# Patient Record
Sex: Male | Born: 1990 | Race: White | Hispanic: No | Marital: Single | Smoking: Current some day smoker
Health system: Southern US, Community
[De-identification: ages and names within clinical notes are randomized; demographics above are authoritative.]

## PROBLEM LIST (undated history)

## (undated) DIAGNOSIS — N390 Urinary tract infection, site not specified: Secondary | ICD-10-CM

## (undated) HISTORY — PX: CIRCUMCISION: SUR203

---

## 2012-03-03 ENCOUNTER — Encounter (HOSPITAL_COMMUNITY): Payer: Self-pay | Admitting: *Deleted

## 2012-03-03 ENCOUNTER — Emergency Department (HOSPITAL_COMMUNITY)
Admission: EM | Admit: 2012-03-03 | Discharge: 2012-03-04 | Disposition: A | Discharge status: Home / Self Care | Payer: Medicaid Other | Source: Home / Self Care | Attending: Emergency Medicine | Admitting: Emergency Medicine

## 2012-03-03 DIAGNOSIS — N390 Urinary tract infection, site not specified: Secondary | ICD-10-CM | POA: Insufficient documentation

## 2012-03-03 DIAGNOSIS — Z9889 Other specified postprocedural states: Secondary | ICD-10-CM | POA: Insufficient documentation

## 2012-03-03 DIAGNOSIS — R339 Retention of urine, unspecified: Secondary | ICD-10-CM | POA: Insufficient documentation

## 2012-03-03 DIAGNOSIS — N5089 Other specified disorders of the male genital organs: Secondary | ICD-10-CM | POA: Insufficient documentation

## 2012-03-03 NOTE — ED Notes (Signed)
Pt presents to ED accompanied by father c/o urinary retention. He stated that he was seen at [facility] 4 wks. ago and a foley catheter was placed. The catheter has been occluded x 4 hours. He denies bladder pressure currently. However had an episode 3 hours ago where the catheter was leaking and he felt pressure. Per pt, [facility] ED was unable to flush catheter.    F/u appt. With [facility] on Wed, and [facility].

## 2012-03-04 ENCOUNTER — Emergency Department (HOSPITAL_COMMUNITY)
Admission: EM | Admit: 2012-03-04 | Discharge: 2012-03-04 | Disposition: A | Discharge status: Home / Self Care | Payer: Medicaid Other | Attending: Emergency Medicine | Admitting: Emergency Medicine

## 2012-03-04 ENCOUNTER — Emergency Department (HOSPITAL_COMMUNITY): Payer: Medicaid Other

## 2012-03-04 DIAGNOSIS — N451 Epididymitis: Secondary | ICD-10-CM

## 2012-03-04 DIAGNOSIS — N390 Urinary tract infection, site not specified: Secondary | ICD-10-CM

## 2012-03-04 LAB — URINALYSIS, ROUTINE W REFLEX MICROSCOPIC
Bilirubin Urine: NEGATIVE
Ketones, ur: NEGATIVE mg/dL
Nitrite: POSITIVE — AB
Specific Gravity, Urine: 1.024 (ref 1.005–1.030)
Urobilinogen, UA: 0.2 mg/dL (ref 0.0–1.0)

## 2012-03-04 LAB — URINE MICROSCOPIC-ADD ON

## 2012-03-04 MED ORDER — SULFAMETHOXAZOLE-TRIMETHOPRIM 800-160 MG PO TABS: 1.0000 | ORAL_TABLET | Freq: Two times a day (BID) | ORAL | Status: AC

## 2012-03-04 MED ORDER — SULFAMETHOXAZOLE-TMP DS 800-160 MG PO TABS
1.0000 | ORAL_TABLET | Freq: Once | ORAL | Status: AC
  Administered 2012-03-04: 1 via ORAL
  Filled 2012-03-04: qty 1

## 2012-03-04 MED ORDER — DOXYCYCLINE HYCLATE 100 MG PO CAPS: 100.0000 mg | ORAL_CAPSULE | Freq: Two times a day (BID) | ORAL | Status: AC

## 2012-03-04 NOTE — Discharge Instructions (Signed)
You were seen and evaluated again for your difficulty urinating to your catheter. This was flushed at this time your providers feel your catheter is working properly. You have been given a new prescription for an antibiotic to take for the next 6 weeks. Please take this as prescribed for the folate that time. It is very important that you keep your appointment at [facility] for further followup and treatment of your conditions. If you develop any worsening symptoms you may return to the emergency room at any time.    Urinary Tract Infection Infections of the urinary tract can start in several places. A bladder infection (cystitis), a kidney infection (pyelonephritis), and a prostate infection (prostatitis) are different types of urinary tract infections (UTIs). They usually get better if treated with medicines (antibiotics) that kill germs. Take all the medicine until it is gone. You or your child may feel better in a few days, but TAKE ALL MEDICINE or the infection may not respond and may become more difficult to treat. HOME CARE INSTRUCTIONS   Drink enough water and fluids to keep the urine clear or pale yellow. Cranberry juice is especially recommended, in addition to large amounts of water.   Avoid caffeine, tea, and carbonated beverages. They tend to irritate the bladder.   Alcohol may irritate the prostate.   Only take over-the-counter or prescription medicines for pain, discomfort, or fever as directed by your caregiver.  To prevent further infections:  Empty the bladder often. Avoid holding urine for long periods of time.   After a bowel movement, women should cleanse from front to back. Use each tissue only once.   Empty the bladder before and after sexual intercourse.  FINDING OUT THE RESULTS OF YOUR TEST Not all test results are available during your visit. If your or your child's test results are not back during the visit, make an appointment with your caregiver to find out the results.  Do not assume everything is normal if you have not heard from your caregiver or the medical facility. It is important for you to follow up on all test results. SEEK MEDICAL CARE IF:   There is back pain.   Your baby is older than 3 months with a rectal temperature of 100.5 F (38.1 C) or higher for more than 1 day.   Your or your child's problems (symptoms) are no better in 3 days. Return sooner if you or your child is getting worse.  SEEK IMMEDIATE MEDICAL CARE IF:   There is severe back pain or lower abdominal pain.   You or your child develops chills.   You have a fever.   Your baby is older than 3 months with a rectal temperature of 102 F (38.9 C) or higher.   Your baby is 48 months old or younger with a rectal temperature of 100.4 F (38 C) or higher.   There is nausea or vomiting.   There is continued burning or discomfort with urination.  MAKE SURE YOU:   Understand these instructions.   Will watch your condition.   Will get help right away if you are not doing well or get worse.  Document Released: 06/07/2005 Document Revised: 08/17/2011 Document Reviewed: 01/10/2007 [facility] Patient Information 2012 [place], [place].

## 2012-03-04 NOTE — ED Notes (Signed)
Foley catheter replaced per MD request, pt tolerated well, + urine return, cloudy, foul smelling, pt states relief, MD made aware

## 2012-03-04 NOTE — ED Provider Notes (Signed)
History     CSN: [id number]  Arrival date & time 03/03/12  2304   First MD Initiated Contact with Patient 03/04/12 0006      Chief Complaint  Patient presents with  . Urinary Retention   HPI  History provided by the patient and father. Patient is a 21 year old male who presents with complaints of urinary retention and Foley catheter blockage. Patient and father are poor historians but report that patient has history of prior urological complication and possible infection in the past with a surgical procedure 4-5 years ago. Patient recently had problems with swelling of his scrotum area and urinary retention. He patient states he was seen at [facility] urology but is not recall which doctor he saw and had a Foley catheter placed. Patient states that this was in 1-2 months ago. She had been doing well since that time and was also scheduled for a specialty followup at [facility] this coming Wednesday. Earlier yesterday patient began to have some bladder pressure and urinary retention with leakage around his Foley catheter. Patient went to the [facility] needed and states it the nurses and staff tried to flush his catheter but were unable. Patient was encouraged to come here for additional evaluation and followup. Patient denies any other associated symptoms. He does report having some increased swelling of his scrotum area over the past one to 2 days. He denies any significant pains at this time. He denies any fever, chills, sweats, nausea vomiting.    No past medical history on file.  Past Surgical History  Procedure Date  . Circumcision     No family history on file.  History  Substance Use Topics  . Smoking status: Not on file  . Smokeless tobacco: Not on file  . Alcohol Use: Not on file      Review of Systems  Constitutional: Negative for fever and chills.  Gastrointestinal: Negative for nausea, vomiting and abdominal pain.  Genitourinary: Positive for scrotal swelling  and difficulty urinating. Negative for flank pain.    Allergies  Review of patient's allergies indicates no known allergies.  Home Medications  No current outpatient prescriptions on file.  BP 168/84  Pulse 101  Temp 98.4 F (36.9 C) (Oral)  Resp 20  SpO2 99%  Physical Exam  Nursing note and vitals reviewed. Constitutional: He is oriented to person, place, and time. He appears well-developed and well-nourished. No distress.  HENT:  Head: Normocephalic.  Cardiovascular: Normal rate and regular rhythm.   Pulmonary/Chest: Effort normal and breath sounds normal.  Abdominal: Soft. There is no tenderness. There is no rebound and no guarding.  Genitourinary:       Large amount of swelling to scrotum.  No erythema or induration.  Foley catheter in place.  Small amount of drainage around catheter.  Neurological: He is alert and oriented to person, place, and time.  Skin: Skin is warm.  Psychiatric: He has a normal mood and affect. His behavior is normal.    ED Course  Procedures   Results for orders placed during the hospital encounter of 03/03/12  URINALYSIS, ROUTINE W REFLEX MICROSCOPIC      Component Value Range   Color, Urine YELLOW  YELLOW   APPearance TURBID (*) CLEAR   Specific Gravity, Urine 1.024  1.005 - 1.030   pH 6.5  5.0 - 8.0   Glucose, UA NEGATIVE  NEGATIVE mg/dL   Hgb urine dipstick LARGE (*) NEGATIVE   Bilirubin Urine NEGATIVE  NEGATIVE   Ketones,  ur NEGATIVE  NEGATIVE mg/dL   Protein, ur >829 (*) NEGATIVE mg/dL   Urobilinogen, UA 0.2  0.0 - 1.0 mg/dL   Nitrite POSITIVE (*) NEGATIVE   Leukocytes, UA LARGE (*) NEGATIVE  URINE MICROSCOPIC-ADD ON      Component Value Range   Squamous Epithelial / LPF RARE  RARE   WBC, UA TOO NUMEROUS TO COUNT  <3 WBC/hpf   RBC / HPF TOO NUMEROUS TO COUNT  <3 RBC/hpf   Bacteria, UA MANY (*) RARE       US Scrotum  03/04/2012  *RADIOLOGY REPORT*  Clinical Data:  Urinary retention.  Status post correction of urethral  stricture at 4 weeks.  Foley catheter.  On antibiotics.  SCROTAL ULTRASOUND DOPPLER ULTRASOUND OF THE TESTICLES  Technique: Complete ultrasound examination of the testicles, epididymis, and other scrotal structures was performed.  Color and spectral Doppler ultrasound were also utilized to evaluate blood flow to the testicles.  Comparison:  None  Findings:  Right testis:  The right testis measures 3.6 x 2.3 x 2.2 cm.  No focal mass lesions.  Homogeneous testicular parenchymal echotexture.  Color flow Doppler images demonstrate normal flow. There is diffuse scrotal skin thickening and skin edema on the right.  Left testis:  The left testis measures 3.5 x 2.3 x 2.4 cm.  No focal mass lesions.  Homogeneous testicular parenchymal echotexture.  Color flow Doppler images demonstrate normal flow. There is diffuse scrotal skin thickening and stent edema on the right.  Right epididymis:  The epididymis demonstrates normal size, appearance, and color flow signal.  Left epididymis:  The mild edema in the left epididymis without abnormal increased flow.  Hydocele:  No significant hydroceles are demonstrated.  Varicocele:  No significant varicoceles are demonstrated.  Pulsed Doppler interrogation of both testes demonstrates low resistance flow bilaterally.  IMPRESSION: Diffuse scrotal skin thickening and edema.  Normal appearance of the testes.  No evidence of mass or torsion.  Suggestion of mild edema in the left epididymis. Normal epididymal flow.  Original Report Authenticated By: Marlon Pel, M.D.   [place] Art/ven Flow Abd Pelv Doppler  03/04/2012  *RADIOLOGY REPORT*  Clinical Data:  Urinary retention.  Status post correction of urethral stricture at 4 weeks.  Foley catheter.  On antibiotics.  SCROTAL ULTRASOUND DOPPLER ULTRASOUND OF THE TESTICLES  Technique: Complete ultrasound examination of the testicles, epididymis, and other scrotal structures was performed.  Color and spectral Doppler ultrasound were also  utilized to evaluate blood flow to the testicles.  Comparison:  None  Findings:  Right testis:  The right testis measures 3.6 x 2.3 x 2.2 cm.  No focal mass lesions.  Homogeneous testicular parenchymal echotexture.  Color flow Doppler images demonstrate normal flow. There is diffuse scrotal skin thickening and skin edema on the right.  Left testis:  The left testis measures 3.5 x 2.3 x 2.4 cm.  No focal mass lesions.  Homogeneous testicular parenchymal echotexture.  Color flow Doppler images demonstrate normal flow. There is diffuse scrotal skin thickening and stent edema on the right.  Right epididymis:  The epididymis demonstrates normal size, appearance, and color flow signal.  Left epididymis:  The mild edema in the left epididymis without abnormal increased flow.  Hydocele:  No significant hydroceles are demonstrated.  Varicocele:  No significant varicoceles are demonstrated.  Pulsed Doppler interrogation of both testes demonstrates low resistance flow bilaterally.  IMPRESSION: Diffuse scrotal skin thickening and edema.  Normal appearance of the testes.  No evidence of mass  or torsion.  Suggestion of mild edema in the left epididymis. Normal epididymal flow.  Original Report Authenticated By: Marlon Pel, M.D.     1. UTI (lower urinary tract infection)       MDM  12:30 AM patient seen and evaluated. Patient in no acute distress.   Patient has refused all blood testing. Urine does show strong signs for UTI. Patient is afebrile but has very slight tachycardia. He does not appear acutely ill or toxic. Blood pressure is stable. Patient has good followup with specialized urology appointment due to this Wednesday and additional followup at [facility] urology on Thursday. At this time we'll discharge home with antibiotics for UTI. Urine culture pending.   Patient was also seen and evaluated by attending physician. Patient has good followup and will be discharged at this time.  Angus Seller,  [place] 03/04/12 [phone]

## 2012-03-04 NOTE — ED Provider Notes (Signed)
Medical screening examination/treatment/procedure(s) were conducted as a shared visit with non-physician practitioner(s) and myself.  I personally evaluated the patient during the encounter This case is well described by PA Dammen.  On my exam this young male was in no distress, his Foley catheter inserted and replaced, and is not actively leaking around it.  The patient's evaluation was largely reassuring, though with continued evidence of an infection.  After the patient's symptoms improved, he was discharged with instructions to follow up with urology expeditiously.  Gerhard Munch, MD 03/04/12 [phone]

## 2012-03-04 NOTE — ED Provider Notes (Signed)
Bern Fare S  5:50AM  patient returns after being seen earlier this morning for similar complaints of Foley catheter complications blockage. See previous note for full details. Patient was found to have signs for ear check in infection with pyuria and large clumps of WBCs. Patient reports that upon leaving he began had slowed drainage from catheter with leakage around the sides of the catheter.   Spoke with Dr. Margarita Grizzle with urology.  He recommends using doxycycline to treat patient's UTI and possible epididymitis for an extended course of 6 weeks. He recommends 100 mg twice a day for first 2 weeks followed by 100 mg daily for the last 4 weeks. He was able to review some notes on the patient which shows that patient has some congenital changes with urological complications. Patient has had prior surgeries at [facility] as a child. He did express importance the patient and father followup with [facility] as planned this week as they will be the best source for the patient to get further advanced treatments for his conditions.   I spoke with patient and father about a change in antibiotics. I also expressed repeatedly the importance to keep appointment at [facility] this week. They expressed their understanding and plan to make the appointment. Patient was advised she may return if he develops any further palpitations with catheter.  Angus Seller, [place] 03/04/12 [phone]

## 2012-03-04 NOTE — ED Notes (Signed)
Per lab/phlebotomist, pt refused lab work.

## 2012-03-04 NOTE — ED Notes (Signed)
Pt alert, nad, returns to ER with cont problems with urinary catheter, no urine output until arrival, cont leakage around catheter

## 2012-03-04 NOTE — ED Notes (Signed)
Ultrasound in progress  

## 2012-03-04 NOTE — ED Notes (Signed)
Attempted to flush foley. Unable to push fluid in or draw from foley. Pt tolerated well, denies pain.

## 2012-03-04 NOTE — Discharge Instructions (Signed)
You were seen and evaluated for your difficulties with urination in your catheter problems. Your catheter was changed for a new catheter. Your urine today does show signs for a urinary tract infection. You have been given a prescription for antibiotics to help treat this infection. Please followup with your urology specialist appointments this week as planned.  Urinary Tract Infection Infections of the urinary tract can start in several places. A bladder infection (cystitis), a kidney infection (pyelonephritis), and a prostate infection (prostatitis) are different types of urinary tract infections (UTIs). They usually get better if treated with medicines (antibiotics) that kill germs. Take all the medicine until it is gone. You or your child may feel better in a few days, but TAKE ALL MEDICINE or the infection may not respond and may become more difficult to treat. HOME CARE INSTRUCTIONS   Drink enough water and fluids to keep the urine clear or pale yellow. Cranberry juice is especially recommended, in addition to large amounts of water.   Avoid caffeine, tea, and carbonated beverages. They tend to irritate the bladder.   Alcohol may irritate the prostate.   Only take over-the-counter or prescription medicines for pain, discomfort, or fever as directed by your caregiver.  To prevent further infections:  Empty the bladder often. Avoid holding urine for long periods of time.   After a bowel movement, women should cleanse from front to back. Use each tissue only once.   Empty the bladder before and after sexual intercourse.  FINDING OUT THE RESULTS OF YOUR TEST Not all test results are available during your visit. If your or your child's test results are not back during the visit, make an appointment with your caregiver to find out the results. Do not assume everything is normal if you have not heard from your caregiver or the medical facility. It is important for you to follow up on all test  results. SEEK MEDICAL CARE IF:   There is back pain.   Your baby is older than 3 months with a rectal temperature of 100.5 F (38.1 C) or higher for more than 1 day.   Your or your child's problems (symptoms) are no better in 3 days. Return sooner if you or your child is getting worse.  SEEK IMMEDIATE MEDICAL CARE IF:   There is severe back pain or lower abdominal pain.   You or your child develops chills.   You have a fever.   Your baby is older than 3 months with a rectal temperature of 102 F (38.9 C) or higher.   Your baby is 35 months old or younger with a rectal temperature of 100.4 F (38 C) or higher.   There is nausea or vomiting.   There is continued burning or discomfort with urination.  MAKE SURE YOU:   Understand these instructions.   Will watch your condition.   Will get help right away if you are not doing well or get worse.  Document Released: 06/07/2005 Document Revised: 08/17/2011 Document Reviewed: 01/10/2007 [facility] Patient Information 2012 [place], [place].   RESOURCE GUIDE  Chronic Pain Problems: Contact [facility]  [phone] Patients need to be referred by their primary care doctor.  Insufficient Money for Medicine: Contact United Way:  call "211" or [facility] ([phone].  No Primary Care Doctor: - Call [facility]  [phone] - can help you locate a primary care doctor that  accepts your insurance, provides certain services, etc. - Physician Referral Service- [phone]  Agencies  that provide inexpensive medical care: - [facility]  [phone] - [facility]  [phone] - [facility]  [phone] - Women's Clinic  [phone] - [facility] Parenthood  [phone] [facility]  [phone]  Medicaid-accepting [facility] Providers: - [facility]- [address]  [phone], Mon-Fri 9am-7pm, Sat 9am-1pm - [facility]- [address] [place], [address] [place]  [phone] - [facility]- [address] [place]  [phone] [facility]- [address]  [phone] - Renaye Rakers- [address] [place], [address], [phone]  Only accepts [place] patients after they have their name  applied to their card  Self Pay (no insurance) in [facility]: - Sickle Cell Patients: Dr Willey Blade, [facility]  [address] [place], [phone] - [facility]- [address] [place]  [phone]       [facility]- [address]       -     [facility]- see information above (Speak to [employer] if you do not have insurance)       -  [facility]- [address] [place], [phone]       -  [facility]- [address],  [phone]       -  [facility]- [address] [phone]       -  Dr Julio Sicks-  [address], [place], [phone]       -  [facility]- [address] [phone]       -  [facility]- [address] [phone], also [address] [phone]       -    [facility]- [address] [place], [phone], 1st & 3rd Saturday   every month, 10am-1pm  1) Find a Doctor and Pay Out of Pocket Although you won't have to find out who is covered by your insurance plan, it is a good idea to ask around and get recommendations. You will then need to call the office and see if the doctor you have chosen will accept you as a new patient and what types of options they offer for patients who are self-pay. Some doctors offer discounts or will set up payment plans for their patients who do not have insurance, but you will need to ask so you aren't surprised when you get to your appointment.  2) Contact Your Local Health Department Not all health departments have doctors that can see patients for sick visits, but many do, so  it is worth a call to see if yours does. If you don't know where your local health department is, you can check in your phone book. The CDC also has a tool to help you locate your state's health department, and many state websites also have listings of all of their local health departments.  3) Find a Walk-in Clinic If your illness is not likely to be very severe or complicated, you may want to try a walk in clinic. These are popping up all over the country in pharmacies, drugstores, and shopping centers. They're usually staffed by nurse practitioners or physician assistants that have been trained to treat common illnesses and complaints. They're usually fairly quick and inexpensive. However,  if you have serious medical issues or chronic medical problems, these are probably not your best option  STD Testing - [facility] Department of [facility] [place], STD Clinic, [address] [place], phone [phone] or [phone].  Monday - Friday, call for an appointment. [facility] Department of [employer], STD Clinic, [address], [place], phone [phone] or [phone].  Monday - Friday, call for an appointment.  Abuse/Neglect: [facility] Child Abuse Hotline 6040468359 [facility] Child Abuse Hotline 5808118222 (After Hours)  Emergency Shelter:  [facility] 906-712-9895  Maternity Homes: - [facility] [place] [facility] (667)[phone] - [facility] (308) [phone]  MRSA Hotline #:   979-812-8404  [facility] Resources  [facility] [place]  [facility] Dept. [address]                 [address]         [address] [place] [address]  Blondell Reveal Phone:  [phone]                                  Phone:  [phone]                   Phone:  [phone]  [facility]  Mental Health, [phone] - [phone]       -     [facility] [place], [address]                                  [phone], [facility] Child Abuse Hotline 843-256-9732 or (708) 113-4780 (After Hours)   Behavioral Health Services  Substance Abuse Resources: - [facility] Drug [facility]  (684) [phone] - [facility] (240)684-4345 - [facility] [facility] 2178098745 Floydene Flock [phone] - Residential & Outpatient Substance Abuse Program  [phone]  Psychological Services: [facility]  [phone] Services  [phone] - [facility], [phone] [place]. [address], [place], ACCESS LINE: (309)350-5975 or 785-263-2015, [employer]  Dental Assistance  If unable to pay or uninsured, contact:  [facility] or [facility]. to become qualified for the adult dental clinic.  Patients with Medicaid: [facility] [phone] [address], [phone] [address] [phone]  If unable to pay, or uninsured, contact [facility] [phone]) or [facility] Department [phone] in [place], [phone] in [facility]) to become qualified for the adult dental clinic  Other Low-Cost Community Dental Services: - [facility]- [address] [place], [place], [place], [address], [phone], Ext. [phone], 2nd and 4th Thursday of the month at 6:30am.  10 clients each day by appointment, can sometimes see walk-in patients if someone does not show for an appointment. -  [facility]- [address] [place] [employer] [place], [place], [address], [phone] - [facility]- [address], [place], [place], [address], [phone] - [facility]- [phone] [facility]- [phone] [facility] Department-  [phone]

## 2012-03-04 NOTE — ED Provider Notes (Signed)
This young male now returns several hours after his initial ED visit w recurrence of his oliguria, and leakage about the catheter.  This improved substantially with irrigation.  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  The patient was again discharged in stable condition, with expedited uro f/u.   Gerhard Munch, MD 03/04/12 [phone]

## 2012-03-04 NOTE — ED Notes (Signed)
Pt refused to have lab work done.  

## 2012-03-05 DIAGNOSIS — N480 Leukoplakia of penis: Secondary | ICD-10-CM | POA: Insufficient documentation

## 2012-03-05 DIAGNOSIS — R339 Retention of urine, unspecified: Secondary | ICD-10-CM | POA: Insufficient documentation

## 2012-03-08 LAB — URINE CULTURE

## 2012-03-09 NOTE — ED Notes (Signed)
+  Urine. +MRSA. Patient given Septra DS (No sensitivity listed) and Doxycycline (Resistant). Chart sent to EDP office for review.

## 2012-03-11 NOTE — ED Notes (Addendum)
Chart returned from EDP office with note that Bactrim is sufficient for MRSA in urine. D/C Doxycycline. Also rx for Macrobid 100 mg by mouth twice daily for 7 days, #14 no refills to cover enterococcus per Lorenz Coaster.

## 2012-03-11 NOTE — ED Notes (Signed)
Father( caregiver) contacted and patient  Is currently being treated by [facility].

## 2012-03-21 ENCOUNTER — Emergency Department (HOSPITAL_COMMUNITY)
Admission: EM | Admit: 2012-03-21 | Discharge: 2012-03-21 | Disposition: A | Discharge status: Home / Self Care | Payer: Medicaid Other | Attending: Emergency Medicine | Admitting: Emergency Medicine

## 2012-03-21 ENCOUNTER — Encounter (HOSPITAL_COMMUNITY): Payer: Self-pay | Admitting: *Deleted

## 2012-03-21 DIAGNOSIS — Z87440 Personal history of urinary (tract) infections: Secondary | ICD-10-CM | POA: Insufficient documentation

## 2012-03-21 DIAGNOSIS — R339 Retention of urine, unspecified: Secondary | ICD-10-CM | POA: Insufficient documentation

## 2012-03-21 HISTORY — DX: Urinary tract infection, site not specified: N39.0

## 2012-03-21 LAB — URINE MICROSCOPIC-ADD ON

## 2012-03-21 LAB — URINALYSIS, ROUTINE W REFLEX MICROSCOPIC
Bilirubin Urine: NEGATIVE
Glucose, UA: NEGATIVE mg/dL
Ketones, ur: NEGATIVE mg/dL
Protein, ur: 30 mg/dL — AB
Urobilinogen, UA: 1 mg/dL (ref 0.0–1.0)

## 2012-03-21 MED ORDER — SULFAMETHOXAZOLE-TRIMETHOPRIM 800-160 MG PO TABS: 1.0000 | ORAL_TABLET | Freq: Two times a day (BID) | ORAL | Status: DC

## 2012-03-21 MED ORDER — NITROFURANTOIN MONOHYD MACRO 100 MG PO CAPS: 100.0000 mg | ORAL_CAPSULE | Freq: Two times a day (BID) | ORAL | Status: DC

## 2012-03-21 NOTE — ED Notes (Signed)
Bladder irrigated. 180 cc total of normal saline introduced into bladder, meeting some resistance. 250 cc of urine/ns returned.  No clots or sediment noted.

## 2012-03-21 NOTE — ED Notes (Signed)
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 Pt was at [facility] three weeks ago and had his indwelling catheter switched out.Pt had to return later that day because of abdominal pain and retention and had catheter irrigated and another one placed. Pt now reporting urinary retention again and abdominal pain. Father reports pt has been on antibiotics prescribed by his urologist. Pt denies pain at this time. States he emptied his leg bag this am, but has not had nay urine collected in it since then. Father at the bedside.

## 2012-03-21 NOTE — ED Notes (Signed)
421 ml per bladder scanner

## 2012-03-21 NOTE — ED Provider Notes (Signed)
History     CSN: [id number]  Arrival date & time 03/21/12  1712   First MD Initiated Contact with Patient 03/21/12 1759      Chief Complaint  Patient presents with  . Urinary Retention    (Consider location/radiation/quality/duration/timing/severity/associated sxs/prior treatment) The history is provided by the patient and a parent.    Patient presents to emergency department with his father at bedside with complaint of urinary retention that was gradual onset this morning with persistent retention throughout the day stating that he has had no return of urine into his Foley catheter bag since this morning. Patient has been followed by [facility] but also [facility]rological specialist at [facility] due to urinary retention. 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 Patient has a followup appointment with [facility] in 1 week to recheck his urinary retention stating that he was had a dilatation of his urethra a week ago and then had Foley cathetar replaced. Both father and patient states that he has had no problems since then until today. Patient was seen in the [facility] ED 3 weeks ago for urinary retention and urinary tract infection. He was placed on antibiotics however Dr. Margarita Grizzle with [facility] called him in a different antibiotic 5 days ago which he has completed. He denies fevers, chills, nausea, vomiting, dysuria, hematuria, or blood in his stool. Patient states he had gradual onset abdominal/pelvic fullness that has worsened throughout the day as he has been unable to release any urine that he states is the same discomfort he had three weeks ago when he had urinary retention. Patient denies aggravating or alleviating factors. He has taken nothing for symptoms PTA.   Past Medical History  Diagnosis Date  . UTI (lower urinary tract infection)     Past Surgical History  Procedure Date  . Circumcision      No family history on file.  History  Substance Use Topics  . Smoking status: Current Some Day Smoker -- 0.2 packs/day  . Smokeless tobacco: Never Used  . Alcohol Use: No      Review of Systems  All other systems reviewed and are negative.    Allergies  Review of patient's allergies indicates no known allergies.  Home Medications  No current outpatient prescriptions on file.  BP 137/84  Pulse 97  Temp 99.1 F (37.3 C) (Oral)  Resp 16  SpO2 96%  Physical Exam  Nursing note and vitals reviewed. Constitutional: He is oriented to person, place, and time. He appears well-developed and well-nourished. No distress.  HENT:  Head: Normocephalic and atraumatic.  Eyes: Conjunctivae are normal.  Neck: Normal range of motion. Neck supple.  Cardiovascular: Normal rate, regular rhythm, normal heart sounds and intact distal pulses.  Exam reveals no gallop and no friction rub.   No murmur heard. Pulmonary/Chest: Effort normal and breath sounds normal. No respiratory distress. He has no wheezes. He has no rales. He exhibits no tenderness.  Abdominal: Bowel sounds are normal. He exhibits no distension and no mass. There is no tenderness. There is no rebound and no guarding.  Musculoskeletal: Normal range of motion.  Neurological: He is alert and oriented to person, place, and time.  Skin: Skin is warm and dry. No rash noted. He is not diaphoretic. No erythema.  Psychiatric: He has a normal mood and affect.    ED Course  Procedures (including critical care time)  Bladder scan  showed over 400 cc of urine with abdominal discomfort resolving as foley is flushed by nursing. Nurse is meeting resistance in flushing of foley and therefore will replace. Urine appears clear without clots.   Labs Reviewed  URINALYSIS, ROUTINE W REFLEX MICROSCOPIC - Abnormal; Notable for the following:    APPearance CLOUDY (*)     Hgb urine dipstick TRACE (*)     Protein, ur 30 (*)     Leukocytes, UA  LARGE (*)     All other components within normal limits  URINE MICROSCOPIC-ADD ON - Abnormal; Notable for the following:    Squamous Epithelial / LPF FEW (*)     Bacteria, UA FEW (*)     All other components within normal limits  URINE CULTURE   No results found.   1. Urinary retention       MDM  Patient has complete resolution of bladder fullness with a new Foley catheter placed with free flowing urine. He has ongoing urinary tract infection. Will treat his urinary tract infection based on a urine culture obtained at the end of June but will send urine for reculture. Will send him out on double abx coverage of Bactrim and Macrobid. Patient to followup appointment with a specialist at [facility] in 1 week and is agreeable to close followup however return to emergency department for emergent changing or worsening symptoms.        Drucie Opitz, [place] 03/21/12 1933

## 2012-03-23 LAB — URINE CULTURE: Colony Count: >=100000

## 2012-03-23 NOTE — ED Provider Notes (Signed)
Medical screening examination/treatment/procedure(s) were conducted as a shared visit with non-physician practitioner(s) and myself.  I personally evaluated the patient during the encounter... patient has known urethral stricture. Followed [employer]. Feeling much better after catheter change and good urine return.  Donnetta Hutching, MD 03/23/12 [phone]

## 2012-03-24 NOTE — ED Notes (Signed)
+   Urine Patient treated with Nitrofuran-sensitive to same-chart appended per protocol MD.

## 2012-03-25 ENCOUNTER — Emergency Department (HOSPITAL_COMMUNITY)
Admission: EM | Admit: 2012-03-25 | Discharge: 2012-03-25 | Disposition: A | Discharge status: Home / Self Care | Payer: Medicaid Other | Attending: Emergency Medicine | Admitting: Emergency Medicine

## 2012-03-25 ENCOUNTER — Encounter (HOSPITAL_COMMUNITY): Payer: Self-pay | Admitting: *Deleted

## 2012-03-25 DIAGNOSIS — T83091A Other mechanical complication of indwelling urethral catheter, initial encounter: Secondary | ICD-10-CM | POA: Insufficient documentation

## 2012-03-25 DIAGNOSIS — R339 Retention of urine, unspecified: Secondary | ICD-10-CM | POA: Insufficient documentation

## 2012-03-25 DIAGNOSIS — F17200 Nicotine dependence, unspecified, uncomplicated: Secondary | ICD-10-CM | POA: Insufficient documentation

## 2012-03-25 DIAGNOSIS — Y849 Medical procedure, unspecified as the cause of abnormal reaction of the patient, or of later complication, without mention of misadventure at the time of the procedure: Secondary | ICD-10-CM | POA: Insufficient documentation

## 2012-03-25 DIAGNOSIS — N35919 Unspecified urethral stricture, male, unspecified site: Secondary | ICD-10-CM | POA: Insufficient documentation

## 2012-03-25 DIAGNOSIS — T83018A Breakdown (mechanical) of other indwelling urethral catheter, initial encounter: Secondary | ICD-10-CM

## 2012-03-25 LAB — URINALYSIS, ROUTINE W REFLEX MICROSCOPIC
Bilirubin Urine: NEGATIVE
Glucose, UA: NEGATIVE mg/dL
Nitrite: NEGATIVE
Specific Gravity, Urine: 1.026 (ref 1.005–1.030)
pH: 5.5 (ref 5.0–8.0)

## 2012-03-25 LAB — URINE MICROSCOPIC-ADD ON

## 2012-03-25 NOTE — ED Notes (Signed)
Out put 360 pt stated he is ready to go home he feel much better RN acknowledge

## 2012-03-25 NOTE — ED Provider Notes (Signed)
History     CSN: [id number]  Arrival date & time 03/25/12  1701   First MD Initiated Contact with Patient 03/25/12 1748      Chief Complaint  Patient presents with  . Urinary Retention    (Consider location/radiation/quality/duration/timing/severity/associated sxs/prior treatment) HPI Pt has foley cath in place for urethral stricture. He has an appointment with [facility] urology tomorrow. He has been treated for MRSA UTI. Was recently in ED with acute blockage of cath. Cath replaced. UA appeared to be recurrent UTI. Started on antibiotics and urine sent for culture. Grew out coag neg staph. Pt denies fever chills, N/V. States he stopped producing urine at around 11:30am today. C/o bladder pressure. No blood noted in urine at any point Past Medical History  Diagnosis Date  . UTI (lower urinary tract infection)     Past Surgical History  Procedure Date  . Circumcision     No family history on file.  History  Substance Use Topics  . Smoking status: Current Some Day Smoker -- 0.2 packs/day  . Smokeless tobacco: Never Used  . Alcohol Use: No      Review of Systems  Constitutional: Negative for fever and chills.  Gastrointestinal: Positive for abdominal pain and abdominal distention. Negative for nausea, vomiting, diarrhea and constipation.  Genitourinary: Positive for decreased urine volume. Negative for dysuria, hematuria and flank pain.    Allergies  Review of patient's allergies indicates no known allergies.  Home Medications   Current Outpatient Rx  Name Route Sig Dispense Refill  . NITROFURANTOIN MONOHYD MACRO 100 MG PO CAPS Oral Take 100 mg by mouth 2 (two) times daily.    . SULFAMETHOXAZOLE-TMP DS 800-160 MG PO TABS Oral Take 1 tablet by mouth 2 (two) times daily.      BP 148/87  Pulse 110  Temp 98.8 F (37.1 C) (Oral)  Resp 20  Wt 245 lb (111.131 kg)  SpO2 99%  Physical Exam  Nursing note and vitals reviewed. Constitutional: He is oriented to person,  place, and time. He appears well-developed and well-nourished. He appears distressed (mild discomfort).  HENT:  Head: Normocephalic and atraumatic.  Mouth/Throat: Oropharynx is clear and moist.  Eyes: EOM are normal. Pupils are equal, round, and reactive to light.  Neck: Normal range of motion. Neck supple.  Cardiovascular: Normal rate and regular rhythm.   Pulmonary/Chest: Effort normal and breath sounds normal. No respiratory distress. He has no wheezes. He has no rales.  Abdominal: Soft. Bowel sounds are normal. He exhibits distension (over supra pubic region). He exhibits no mass. There is no tenderness. There is no rebound and no guarding.  Musculoskeletal: Normal range of motion. He exhibits no edema and no tenderness.  Neurological: He is alert and oriented to person, place, and time.  Skin: Skin is warm and dry. No rash noted. No erythema.  Psychiatric: He has a normal mood and affect. His behavior is normal.    ED Course  Procedures (including critical care time)  Labs Reviewed  URINALYSIS, ROUTINE W REFLEX MICROSCOPIC - Abnormal; Notable for the following:    Color, Urine AMBER (*)  BIOCHEMICALS MAY BE AFFECTED BY COLOR   Leukocytes, UA SMALL (*)     All other components within normal limits  URINE MICROSCOPIC-ADD ON - Abnormal; Notable for the following:    Squamous Epithelial / LPF FEW (*)     Bacteria, UA FEW (*)     All other components within normal limits   No results found.  1. Urinary catheter dysfunction       MDM  Will change foley and recheck urine.  Feeling better after cath change. F/u with urology       Loren Racer, MD 03/25/12 [phone]

## 2012-03-25 NOTE — ED Notes (Signed)
Parent states "this is the 4th time we've been here, the doctor @ [facility] said just to leave the catheter until Wed when he sees him but he has not had anything in his bag since 1130 this morning."

## 2012-03-25 NOTE — ED Notes (Addendum)
Pt said he is feel relieved out put 350cc

## 2012-04-19 DIAGNOSIS — E669 Obesity, unspecified: Secondary | ICD-10-CM | POA: Insufficient documentation

## 2013-01-10 IMAGING — US US SCROTUM
1 series · 13 of 25 positions shown · non-contrast
Comparison: None

CLINICAL DATA: Urinary retention.  Status post correction of
urethral stricture at 4 weeks.  Foley catheter.  On antibiotics.

SCROTAL ULTRASOUND
DOPPLER ULTRASOUND OF THE TESTICLES
TECHNIQUE: Complete ultrasound examination of the testicles,
epididymis, and other scrotal structures was performed.  Color and
spectral Doppler ultrasound were also utilized to evaluate blood
flow to the testicles.

[Series 1: us scrotum · 0.09mm/px · 13 of 52 slices shown]
[im 1/52]
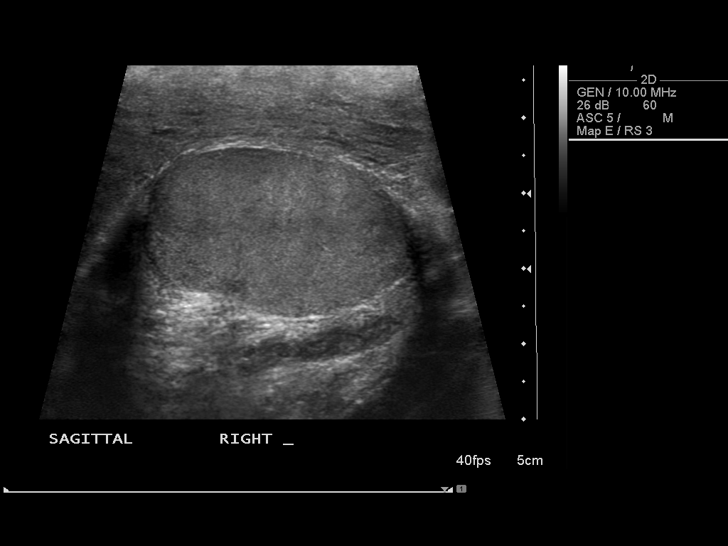
[im 5/52]
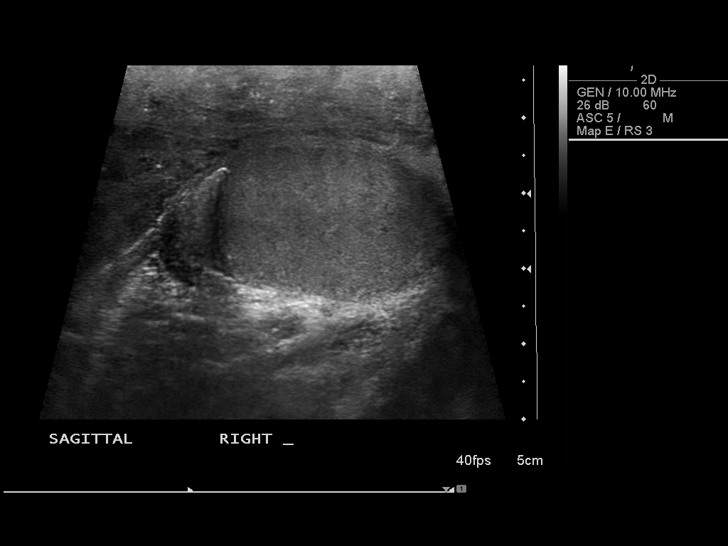
[im 9/52]
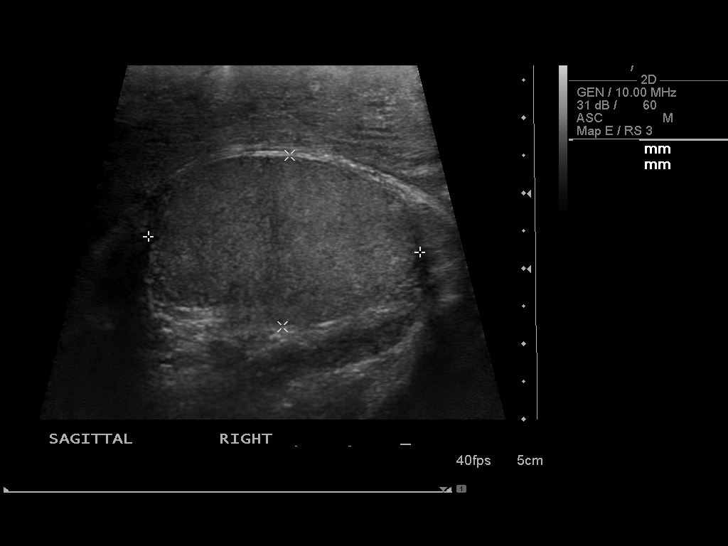
[im 13/52]
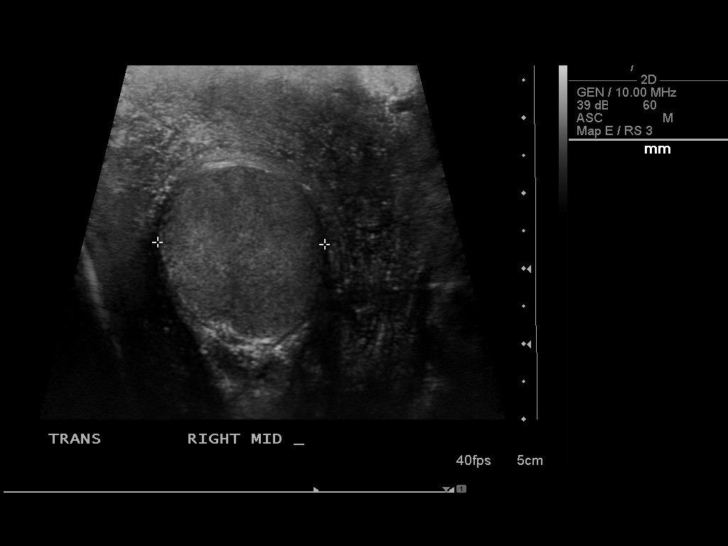
[im 18/52]
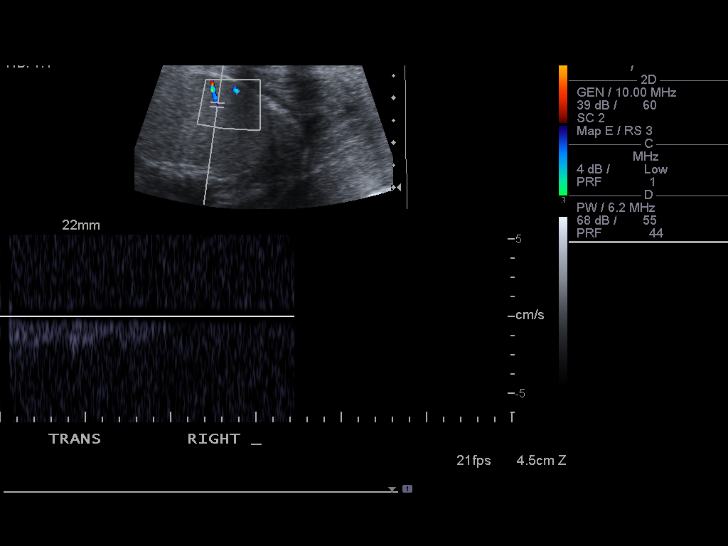
[im 22/52]
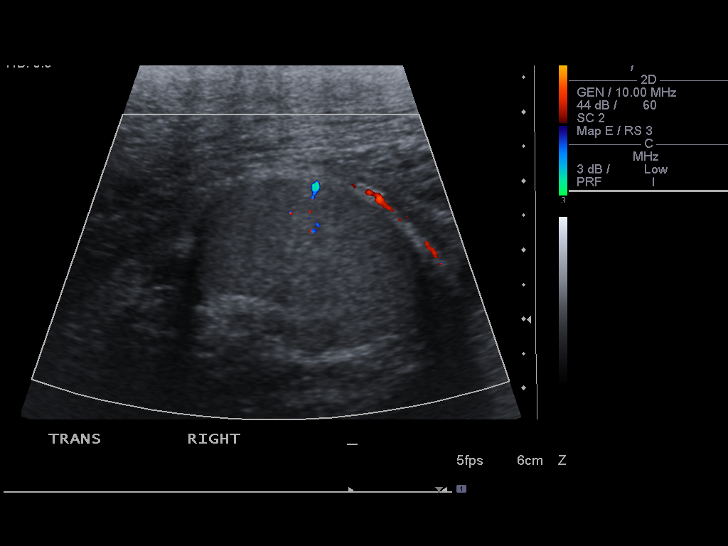
[im 26/52]
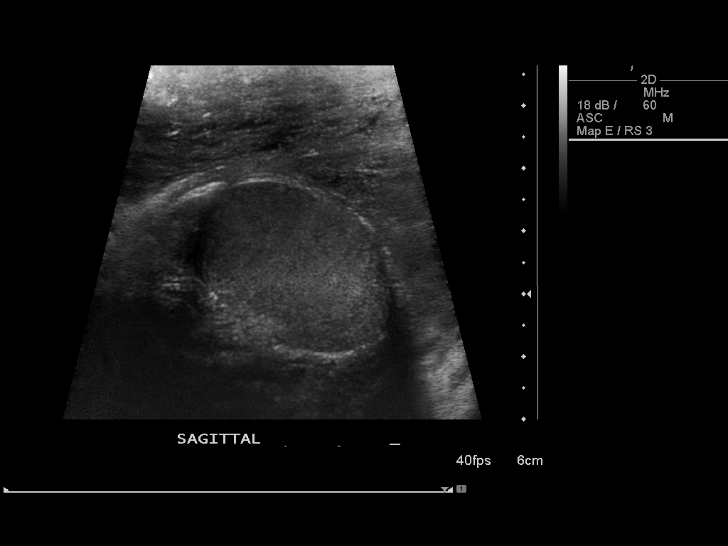
[im 30/52]
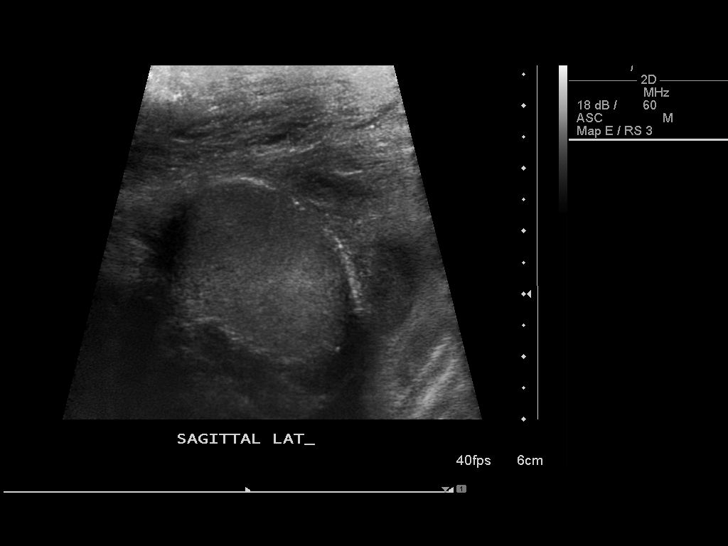
[im 35/52]
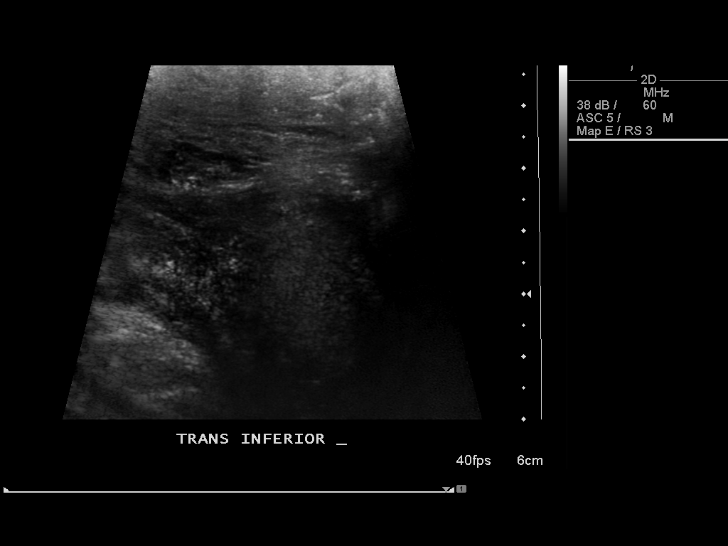
[im 39/52]
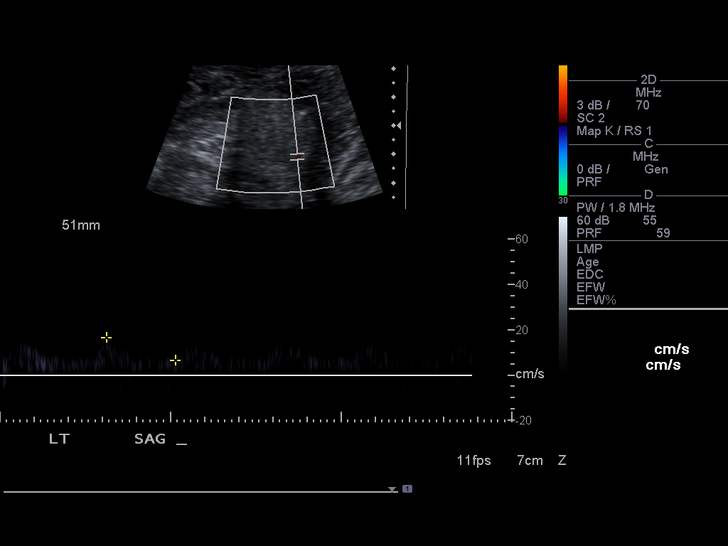
[im 43/52]
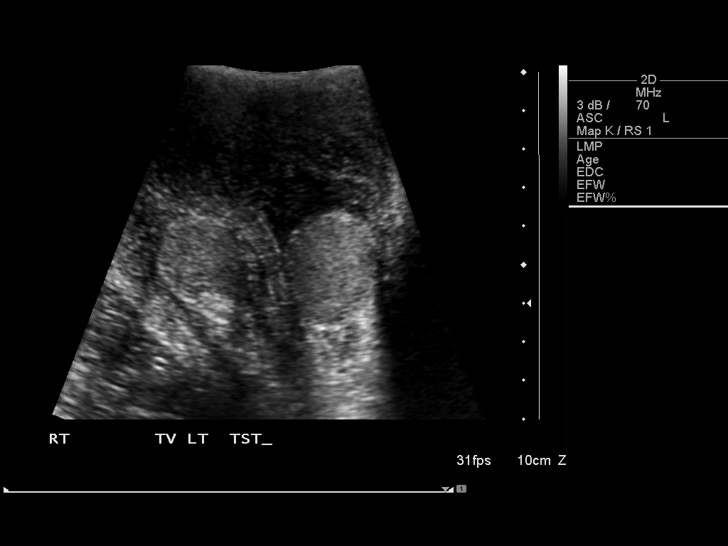
[im 47/52]
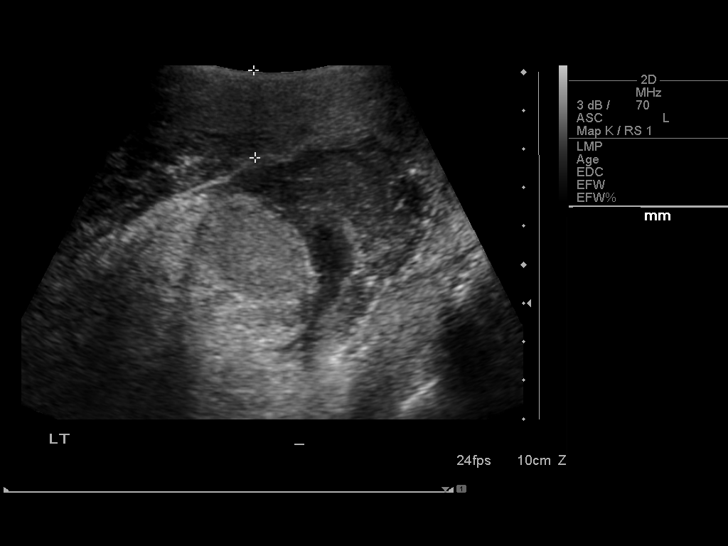
[im 52/52]
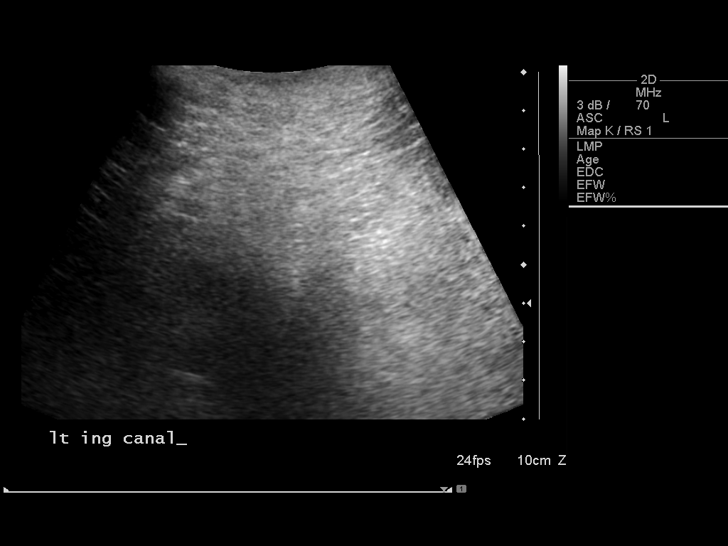

[13 of 25 positions shown; findings below may reference images not displayed]

FINDINGS: Right testis:  The right testis measures 3.6 x 2.3 x 2.2 cm.  No
focal mass lesions.  Homogeneous testicular parenchymal
echotexture.  Color flow Doppler images demonstrate normal flow.
There is diffuse scrotal skin thickening and skin edema on the
right.

Left testis:  The left testis measures 3.5 x 2.3 x 2.4 cm.  No
focal mass lesions.  Homogeneous testicular parenchymal
echotexture.  Color flow Doppler images demonstrate normal flow.
There is diffuse scrotal skin thickening and stent edema on the
right.

Right epididymis:  The epididymis demonstrates normal size,
appearance, and color flow signal.

Left epididymis:  The mild edema in the left epididymis without
abnormal increased flow.

Hydocele:  No significant hydroceles are demonstrated.

Varicocele:  No significant varicoceles are demonstrated.

Pulsed Doppler interrogation of both testes demonstrates low
resistance flow bilaterally.
IMPRESSION: Diffuse scrotal skin thickening and edema.  Normal appearance of
the testes.  No evidence of mass or torsion.  Suggestion of mild
edema in the left epididymis. Normal epididymal flow.

## 2013-11-26 DIAGNOSIS — N35919 Unspecified urethral stricture, male, unspecified site: Secondary | ICD-10-CM | POA: Insufficient documentation

## 2017-01-30 ENCOUNTER — Encounter: Payer: Self-pay | Admitting: Physician Assistant

## 2017-01-30 ENCOUNTER — Ambulatory Visit (INDEPENDENT_AMBULATORY_CARE_PROVIDER_SITE_OTHER): Payer: 59 | Admitting: Physician Assistant

## 2017-01-30 VITALS — BP 114/69 | HR 61 | Temp 98.9°F | Ht 69.0 in | Wt 216.0 lb

## 2017-01-30 DIAGNOSIS — N478 Other disorders of prepuce: Secondary | ICD-10-CM | POA: Diagnosis not present

## 2017-01-30 DIAGNOSIS — R300 Dysuria: Secondary | ICD-10-CM

## 2017-01-30 DIAGNOSIS — IMO0001 Reserved for inherently not codable concepts without codable children: Secondary | ICD-10-CM | POA: Insufficient documentation

## 2017-01-30 DIAGNOSIS — N3001 Acute cystitis with hematuria: Secondary | ICD-10-CM

## 2017-01-30 MED ORDER — CIPROFLOXACIN HCL 500 MG PO TABS: 500.0000 mg | ORAL_TABLET | Freq: Two times a day (BID) | ORAL | 0 refills | Status: DC

## 2017-01-30 NOTE — Progress Notes (Signed)
BP 114/69   Pulse 61   Temp 98.9 F (37.2 C) (Oral)   Ht 5\' 9"  (1.753 m)   Wt 216 lb (98 kg)   BMI 31.90 kg/m    Subjective:    Patient ID: Rodney Guerrero, male    DOB: 10/25/1990, 26 y.o.   MRN: [id number]  HPI: Rodney Guerrero is a 26 y.o. male presenting on 01/30/2017 for Urinary Tract Infection  This patient has had several days of dysuria, frequency and nocturia. There is also pain over the bladder in the suprapubic region, no back pain. Denies leakage or hematuria.  Denies fever or chills. No pain in flank area. 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  Relevant past medical, surgical, family and social history reviewed and updated as indicated. Allergies and medications reviewed and updated.  Past Medical History:  Diagnosis Date  . UTI (lower urinary tract infection)     Past Surgical History:  Procedure Laterality Date  . CIRCUMCISION      Review of Systems  Constitutional: Negative.  Negative for appetite change and fatigue.  Eyes: Negative for pain and visual disturbance.  Respiratory: Negative.  Negative for cough, chest tightness, shortness of breath and wheezing.   Cardiovascular: Negative.  Negative for chest pain, palpitations and leg swelling.  Gastrointestinal: Negative.  Negative for abdominal pain, diarrhea, nausea and vomiting.  Genitourinary: Positive for difficulty urinating, dysuria, frequency and hematuria.  Skin: Negative.  Negative for color change and rash.  Neurological: Negative.  Negative for weakness, numbness and headaches.  Psychiatric/Behavioral: Negative.     Allergies as of 01/30/2017   No Known Allergies     Medication List       Accurate as of 01/30/17 11:16 AM. Always use your most recent med list.          ciprofloxacin 500 MG tablet Commonly known as:  CIPRO Take 1 tablet (500 mg total) by mouth 2 (two) times daily.            Objective:    BP 114/69   Pulse 61   Temp 98.9 F (37.2 C) (Oral)   Ht 5\' 9"  (1.753 m)   Wt 216 lb (98 kg)   BMI 31.90 kg/m   No Known Allergies  Physical Exam  Constitutional: He appears well-developed and well-nourished. No distress.  HENT:  Head: Normocephalic and atraumatic.  Eyes: Conjunctivae and EOM are normal. Pupils are equal, round, and reactive to light.  Cardiovascular: Normal rate, regular rhythm and normal heart sounds.   Pulmonary/Chest: Effort normal and breath sounds normal. No respiratory distress.  Abdominal: Bowel sounds are normal. There is no hepatosplenomegaly. There is tenderness in the suprapubic area. There is no CVA tenderness.  Skin: Skin is warm and dry.  Psychiatric: He has a normal mood and affect. His behavior is normal.  Nursing note and vitals reviewed.   Results for orders placed or performed during the hospital encounter of 03/25/12  Urinalysis, Routine w reflex microscopic  Result Value Ref Range   Color, Urine AMBER (A) YELLOW   APPearance CLEAR CLEAR   Specific Gravity, Urine 1.026 1.005 - 1.030   pH 5.5 5.0 - 8.0   Glucose, UA NEGATIVE NEGATIVE mg/dL   Hgb urine dipstick NEGATIVE NEGATIVE   Bilirubin Urine NEGATIVE NEGATIVE   Ketones, ur NEGATIVE NEGATIVE mg/dL   Protein, ur NEGATIVE NEGATIVE mg/dL   Urobilinogen, UA 0.2 0.0 - 1.0 mg/dL  Nitrite NEGATIVE NEGATIVE   Leukocytes, UA SMALL (A) NEGATIVE  Urine microscopic-add on  Result Value Ref Range   Squamous Epithelial / LPF FEW (A) RARE   WBC, UA 3-6 <3 WBC/hpf   Bacteria, UA FEW (A) RARE   Urine-Other MUCOUS PRESENT       Assessment & Plan:   1. Dysuria - Urine culture - Urinalysis, Complete  2. Acute cystitis with hematuria - ciprofloxacin (CIPRO) 500 MG tablet; Take 1 tablet (500 mg total) by mouth 2 (two) times daily.  Dispense: 20 tablet; Refill: 0   Continue all other maintenance medications as listed above.  Follow up plan: Return if symptoms worsen or  fail to improve.  Educational handout given for UTI  Remus LofflerAngel S. Shyonna Carlin PA-C [facility] [address]  [place], [place] [id number] [phone]   01/30/2017, 11:16 AM

## 2017-01-30 NOTE — Patient Instructions (Signed)
Urinary Tract Infection, Adult °A urinary tract infection (UTI) is an infection of any part of the urinary tract. The urinary tract includes the: °· Kidneys. °· Ureters. °· Bladder. °· Urethra. °These organs make, store, and get rid of pee (urine) in the body. °Follow these instructions at home: °· Take over-the-counter and prescription medicines only as told by your doctor. °· If you were prescribed an antibiotic medicine, take it as told by your doctor. Do not stop taking the antibiotic even if you start to feel better. °· Avoid the following drinks: °¨ Alcohol. °¨ Caffeine. °¨ Tea. °¨ Carbonated drinks. °· Drink enough fluid to keep your pee clear or pale yellow. °· Keep all follow-up visits as told by your doctor. This is important. °· Make sure to: °¨ Empty your bladder often and completely. Do not to hold pee for long periods of time. °¨ Empty your bladder before and after sex. °¨ Wipe from front to back after a bowel movement if you are male. Use each tissue one time when you wipe. °Contact a doctor if: °· You have back pain. °· You have a fever. °· You feel sick to your stomach (nauseous). °· You throw up (vomit). °· Your symptoms do not get better after 3 days. °· Your symptoms go away and then come back. °Get help right away if: °· You have very bad back pain. °· You have very bad lower belly (abdominal) pain. °· You are throwing up and cannot keep down any medicines or water. °This information is not intended to replace advice given to you by your health care provider. Make sure you discuss any questions you have with your health care provider. °Document Released: 02/14/2008 Document Revised: 02/03/2016 Document Reviewed: 07/19/2015 °Elsevier Interactive Patient Education © 2017 [employer] ° °

## 2017-01-31 ENCOUNTER — Encounter: Payer: Self-pay | Admitting: *Deleted

## 2017-02-01 LAB — MICROSCOPIC EXAMINATION
RBC, UA: >30 /hpf — AB (ref 0–?)
Renal Epithel, UA: NONE SEEN /hpf
WBC, UA: >30 /hpf — AB (ref 0–?)

## 2017-02-01 LAB — URINALYSIS, COMPLETE
Bilirubin, UA: NEGATIVE
Glucose, UA: NEGATIVE
NITRITE UA: NEGATIVE
SPEC GRAV UA: 1.02 (ref 1.005–1.030)
UUROB: 0.2 mg/dL (ref 0.2–1.0)
pH, UA: 8.5 — ABNORMAL HIGH (ref 5.0–7.5)

## 2017-02-01 LAB — URINE CULTURE

## 2017-06-04 ENCOUNTER — Other Ambulatory Visit: Payer: Self-pay | Admitting: Physician Assistant

## 2017-06-04 DIAGNOSIS — N3001 Acute cystitis with hematuria: Secondary | ICD-10-CM

## 2017-06-04 NOTE — Telephone Encounter (Signed)
Last seen 01/30/17  Rodney Guerrero 

## 2017-11-08 ENCOUNTER — Other Ambulatory Visit: Payer: Self-pay | Admitting: Physician Assistant

## 2017-11-08 DIAGNOSIS — N3001 Acute cystitis with hematuria: Secondary | ICD-10-CM

## 2017-11-19 ENCOUNTER — Other Ambulatory Visit: Payer: Self-pay | Admitting: Physician Assistant

## 2017-11-19 DIAGNOSIS — N3001 Acute cystitis with hematuria: Secondary | ICD-10-CM

## 2018-04-04 ENCOUNTER — Telehealth: Payer: Self-pay | Admitting: Physician Assistant

## 2018-04-04 NOTE — Telephone Encounter (Signed)
Father states that son is on drugs and is needing help. Father aware that we need to schedule appointment to come in and discuss.

## 2018-04-05 ENCOUNTER — Telehealth: Payer: Self-pay | Admitting: Physician Assistant

## 2018-04-05 NOTE — Telephone Encounter (Signed)
appt scheduled

## 2018-04-08 ENCOUNTER — Ambulatory Visit: Payer: 59 | Admitting: Physician Assistant

## 2018-04-15 ENCOUNTER — Ambulatory Visit: Payer: 59 | Admitting: Physician Assistant

## 2018-04-16 ENCOUNTER — Encounter: Payer: Self-pay | Admitting: Physician Assistant

## 2023-09-27 ENCOUNTER — Ambulatory Visit: Payer: Medicaid Other | Admitting: Family Medicine

## 2023-09-27 ENCOUNTER — Encounter: Payer: Self-pay | Admitting: Family Medicine

## 2023-09-27 VITALS — BP 123/73 | Temp 98.3°F | Ht 70.5 in | Wt 216.0 lb

## 2023-09-27 DIAGNOSIS — R8290 Unspecified abnormal findings in urine: Secondary | ICD-10-CM | POA: Diagnosis not present

## 2023-09-27 DIAGNOSIS — N35919 Unspecified urethral stricture, male, unspecified site: Secondary | ICD-10-CM | POA: Diagnosis not present

## 2023-09-27 LAB — MICROSCOPIC EXAMINATION
Epithelial Cells (non renal): NONE SEEN /[HPF] (ref 0–10)
RBC, Urine: NONE SEEN /[HPF] (ref 0–2)
Renal Epithel, UA: NONE SEEN /[HPF]
WBC, UA: >30 /[HPF] — AB (ref 0–5)
Yeast, UA: NONE SEEN

## 2023-09-27 LAB — URINALYSIS, ROUTINE W REFLEX MICROSCOPIC
Bilirubin, UA: NEGATIVE
Glucose, UA: NEGATIVE
Nitrite, UA: NEGATIVE
Specific Gravity, UA: 1.025 (ref 1.005–1.030)
Urobilinogen, Ur: 0.2 mg/dL (ref 0.2–1.0)
pH, UA: 6.5 (ref 5.0–7.5)

## 2023-09-27 MED ORDER — CEFTRIAXONE SODIUM 1 G IJ SOLR
1.0000 g | Freq: Once | INTRAMUSCULAR | Status: AC
  Administered 2023-09-27: 1 g via INTRAMUSCULAR

## 2023-09-27 MED ORDER — SULFAMETHOXAZOLE-TRIMETHOPRIM 800-160 MG PO TABS: 1.0000 | ORAL_TABLET | Freq: Two times a day (BID) | ORAL | 0 refills | Status: AC

## 2023-09-27 NOTE — Progress Notes (Signed)
New Patient Office Visit  Subjective   Patient ID: Rodney Guerrero, male    DOB: 1991/09/01  Age: 33 y.o. MRN: [id number]  CC:  Chief Complaint  Patient presents with   New Patient (Initial Visit)    Establish care UTI symptoms    HPI Rodney Guerrero presents to establish care Patient presents today with his father. His main concern today is malodorous urine. States that he has "infection in his urine". Reports that urine is odorous, darker, and he has increased frequency.  Denies retention, fever, abdominal and low back pain. States that he is using clobetasol. He is completing application typically once a day, sometimes skips a day.  He has not followed up with urology in several years. He was recently treated for UTI in November 2024 with cipro.   Outpatient Encounter Medications as of 09/27/2023  Medication Sig   clobetasol ointment (TEMOVATE) 0.05 % Apply topically.   [DISCONTINUED] ciprofloxacin (CIPRO) 500 MG tablet TAKE 1 TABLET BY MOUTH TWICE A DAY   No facility-administered encounter medications on file as of 09/27/2023.    Past Medical History:  Diagnosis Date   UTI (lower urinary tract infection)     Past Surgical History:  Procedure Laterality Date   CIRCUMCISION      History reviewed. No pertinent family history.  Social History   Socioeconomic History   Marital status: Single    Spouse name: Not on file   Number of children: Not on file   Years of education: Not on file   Highest education level: Not on file  Occupational History   Not on file  Tobacco Use   Smoking status: Some Days    Current packs/day: 0.25    Types: Cigarettes   Smokeless tobacco: Never  Substance and Sexual Activity   Alcohol use: No   Drug use: No   Sexual activity: Never  Other Topics Concern   Not on file  Social History Narrative   Not on file   Social Drivers of Health   Financial Resource Strain: Not on file  Food Insecurity: Not on file  Transportation  Needs: Not on file  Physical Activity: Not on file  Stress: Not on file  Social Connections: Not on file  Intimate Partner Violence: Not on file    ROS As per HPI   Objective   BP 123/73   Temp 98.3 F (36.8 C)   Ht 5' 10.5" (1.791 m)   Wt 216 lb (98 kg)   SpO2 94%   BMI 30.55 kg/m   Physical Exam Constitutional:      General: He is awake. He is not in acute distress.    Appearance: Normal appearance. He is well-developed and well-groomed. He is not ill-appearing, toxic-appearing or diaphoretic.  Cardiovascular:     Rate and Rhythm: Normal rate and regular rhythm.     Pulses: Normal pulses.          Radial pulses are 2+ on the right side and 2+ on the left side.       Posterior tibial pulses are 2+ on the right side and 2+ on the left side.     Heart sounds: Normal heart sounds. No murmur heard.    No gallop.  Pulmonary:     Effort: Pulmonary effort is normal. No respiratory distress.     Breath sounds: Normal breath sounds. No stridor. No wheezing, rhonchi or rales.  Musculoskeletal:     Cervical back: Full passive range of  motion without pain and neck supple.     Right lower leg: No edema.     Left lower leg: No edema.  Skin:    General: Skin is warm.     Capillary Refill: Capillary refill takes less than 2 seconds.  Neurological:     General: No focal deficit present.     Mental Status: He is alert, oriented to person, place, and time and easily aroused. Mental status is at baseline.     GCS: GCS eye subscore is 4. GCS verbal subscore is 5. GCS motor subscore is 6.     Motor: No weakness.  Psychiatric:        Attention and Perception: Attention and perception normal.        Mood and Affect: Mood and affect normal.        Speech: Speech normal.        Behavior: Behavior normal. Behavior is cooperative.        Thought Content: Thought content normal. Thought content does not include homicidal or suicidal ideation. Thought content does not include homicidal or  suicidal plan.        Cognition and Memory: Cognition and memory normal.        Judgment: Judgment normal.        09/27/2023    1:37 PM 01/30/2017   10:59 AM  Depression screen PHQ 2/9  Decreased Interest 0 0  Down, Depressed, Hopeless 0 0  PHQ - 2 Score 0 0  Altered sleeping 0   Tired, decreased energy 0   Change in appetite 0   Feeling bad or failure about yourself  0   Trouble concentrating 0   Moving slowly or fidgety/restless 0   Suicidal thoughts 0   PHQ-9 Score 0   Difficult doing work/chores Not difficult at all       09/27/2023    1:37 PM  GAD 7 : Generalized Anxiety Score  Nervous, Anxious, on Edge 0  Control/stop worrying 0  Worry too much - different things 0  Trouble relaxing 0  Restless 0  Easily annoyed or irritable 0  Afraid - awful might happen 0  Total GAD 7 Score 0  Anxiety Difficulty Not difficult at all   Assessment & Plan:  1. Malodorous urine (Primary) Based on labs today, will treat for complex UTI in male. Patient recently had cipro. To minimize potential adverse effects will treat with Rocephin 1 gm followed by bactrim as below. Given patient history and recurrent UTIs will refer to urology for patient to establish and discuss additional treatment options.  - Urinalysis, Routine w reflex microscopic - Urine Culture - sulfamethoxazole-trimethoprim (BACTRIM DS) 800-160 MG tablet; Take 1 tablet by mouth 2 (two) times daily for 10 days.  Dispense: 20 tablet; Refill: 0 - Ambulatory referral to Urology - cefTRIAXone (ROCEPHIN) injection 1 g  2. Stricture of male urethra, unspecified stricture type As above.  - clobetasol ointment (TEMOVATE) 0.05 %; Apply topically. - sulfamethoxazole-trimethoprim (BACTRIM DS) 800-160 MG tablet; Take 1 tablet by mouth 2 (two) times daily for 10 days.  Dispense: 20 tablet; Refill: 0 - Ambulatory referral to Urology - cefTRIAXone (ROCEPHIN) injection 1 g  The above assessment and management plan was discussed with  the patient. The patient verbalized understanding of and has agreed to the management plan using shared-decision making. Patient is aware to call the clinic if they develop any new symptoms or if symptoms fail to improve or worsen. Patient is aware when to return  to the clinic for a follow-up visit. Patient educated on when it is appropriate to go to the emergency department.   Return in about 3 months (around 12/26/2023) for CPE.   Neale Burly, DNP-FNP [facility] [address] [place], [place] [address] [phone]

## 2023-09-28 LAB — URINE CULTURE

## 2023-10-02 ENCOUNTER — Encounter: Payer: Self-pay | Admitting: Family Medicine

## 2023-10-02 NOTE — Progress Notes (Signed)
No bacteria found on UTI. Recommend patient follow up with urology. Referral placed.

## 2023-11-26 ENCOUNTER — Telehealth: Payer: Self-pay

## 2023-11-26 NOTE — Telephone Encounter (Signed)
 Pt's father came into office requesting to speak with nurse. Spoke with pt's father per dpr. Pt's father requesting rx to help pt with substance abuse. Informed father that pt would need to be seen before anything can be prescribed and that he may need to be referred out to get any kind of rx. Father requested an appt to be made for pt, appt scheduled for tomorrow with pcp. Pt's father aware and verbalized understanding.

## 2023-11-27 ENCOUNTER — Ambulatory Visit: Admitting: Family Medicine

## 2024-02-13 ENCOUNTER — Ambulatory Visit: Admitting: Family Medicine

## 2024-02-13 ENCOUNTER — Encounter: Payer: Self-pay | Admitting: Family Medicine

## 2024-02-13 VITALS — BP 127/85 | HR 80 | Temp 98.5°F | Ht 70.5 in | Wt 216.0 lb

## 2024-02-13 DIAGNOSIS — F1910 Other psychoactive substance abuse, uncomplicated: Secondary | ICD-10-CM | POA: Diagnosis not present

## 2024-02-13 NOTE — Progress Notes (Signed)
 Subjective:  Patient ID: Rodney Guerrero, male    DOB: 07-09-1991, 33 y.o.   MRN: [id number]  Patient Care Team: Rodney Crate, FNP as PCP - General (Family Medicine)   Chief Complaint:  Addiction Problem  HPI: Rodney Guerrero is a 33 y.o. male presenting on 02/13/2024 for Addiction Problem  HPI Patient presents today with father. 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 Reports that he started when he was 33 years old. Method of use through his nose. Reports daily use, last use yesterday. States that he is "just ready". Denies SI.   Relevant past medical, surgical, family, and social history reviewed and updated as indicated.  Allergies and medications reviewed and updated. Data reviewed: Chart in Epic.   Past Medical History:  Diagnosis Date   UTI (lower urinary tract infection)     Past Surgical History:  Procedure Laterality Date   CIRCUMCISION      Social History   Socioeconomic History   Marital status: Single    Spouse name: Not on file   Number of children: Not on file   Years of education: Not on file   Highest education level: Not on file  Occupational History   Not on file  Tobacco Use   Smoking status: Some Days    Current packs/day: 0.25    Types: Cigarettes   Smokeless tobacco: Never  Substance and Sexual Activity   Alcohol use: No   Drug use: No   Sexual activity: Never  Other Topics Concern   Not on file  Social History Narrative   Not on file   Social Drivers of Health   Financial Resource Strain: Not on file  Food Insecurity: Not on file  Transportation Needs: Not on file  Physical Activity: Not on file  Stress: Not on file  Social Connections: Not on file  Intimate Partner Violence: Not on file    Outpatient Encounter Medications as of 02/13/2024  Medication Sig   [DISCONTINUED] clobetasol ointment (TEMOVATE) 0.05 % Apply topically.   No facility-administered encounter medications on file as of  02/13/2024.    No Known Allergies  Review of Systems As per HPI  Objective:  BP 127/85   Pulse 80   Temp 98.5 F (36.9 C)   Ht 5' 10.5" (1.791 m)   Wt 216 lb (98 kg)   SpO2 96%   BMI 30.55 kg/m    Wt Readings from Last 3 Encounters:  02/13/24 216 lb (98 kg)  09/27/23 216 lb (98 kg)  01/30/17 216 lb (98 kg)   Physical Exam Constitutional:      General: He is awake. He is not in acute distress.    Appearance: Normal appearance. He is well-developed and well-groomed. He is not ill-appearing, toxic-appearing or diaphoretic.  Eyes:     Comments: Watery eyes   Cardiovascular:     Rate and Rhythm: Normal rate and regular rhythm.     Pulses: Normal pulses.     Heart sounds: Normal heart sounds. No murmur heard.    No gallop.  Pulmonary:     Effort: Pulmonary effort is normal. No respiratory distress.     Breath sounds: Normal breath sounds. No stridor. No wheezing, rhonchi or rales.  Musculoskeletal:     Cervical back: Full passive range of motion without pain and neck supple.  Skin:    General: Skin is warm.     Capillary Refill: Capillary refill takes less than 2  seconds.  Neurological:     General: No focal deficit present.     Mental Status: He is alert, oriented to person, place, and time and easily aroused. Mental status is at baseline.     GCS: GCS eye subscore is 4. GCS verbal subscore is 5. GCS motor subscore is 6.     Motor: No weakness.  Psychiatric:        Attention and Perception: Attention and perception normal.        Mood and Affect: Mood and affect normal.        Speech: Speech normal.        Behavior: Behavior normal. Behavior is cooperative.        Thought Content: Thought content normal. Thought content does not include homicidal or suicidal ideation. Thought content does not include homicidal or suicidal plan.        Cognition and Memory: Cognition and memory normal.        Judgment: Judgment normal.     Results for orders placed or performed in  visit on 09/27/23  Urine Culture   Collection Time: 09/27/23  1:21 PM   Specimen: Urine   UR  Result Value Ref Range   Urine Culture, Routine Final report    Organism ID, Bacteria Comment   Microscopic Examination   Collection Time: 09/27/23  1:21 PM   Urine  Result Value Ref Range   WBC, UA >30 (A) 0 - 5 /hpf   RBC, Urine None seen 0 - 2 /hpf   Epithelial Cells (non renal) None seen 0 - 10 /hpf   Renal Epithel, UA None seen None seen /hpf   Bacteria, UA Many (A) None seen/Few   Yeast, UA None seen None seen  Urinalysis, Routine w reflex microscopic   Collection Time: 09/27/23  1:21 PM  Result Value Ref Range   Specific Gravity, UA 1.025 1.005 - 1.030   pH, UA 6.5 5.0 - 7.5   Color, UA Yellow Yellow   Appearance Ur Cloudy (A) Clear   Leukocytes,UA 3+ (A) Negative   Protein,UA 1+ (A) Negative/Trace   Glucose, UA Negative Negative   Ketones, UA Trace (A) Negative   RBC, UA Trace (A) Negative   Bilirubin, UA Negative Negative   Urobilinogen, Ur 0.2 0.2 - 1.0 mg/dL   Nitrite, UA Negative Negative   Microscopic Examination See below:        09/27/2023    1:37 PM 01/30/2017   10:59 AM  Depression screen PHQ 2/9  Decreased Interest 0 0  Down, Depressed, Hopeless 0 0  PHQ - 2 Score 0 0  Altered sleeping 0   Tired, decreased energy 0   Change in appetite 0   Feeling bad or failure about yourself  0   Trouble concentrating 0   Moving slowly or fidgety/restless 0   Suicidal thoughts 0   PHQ-9 Score 0   Difficult doing work/chores Not difficult at all        09/27/2023    1:37 PM  GAD 7 : Generalized Anxiety Score  Nervous, Anxious, on Edge 0  Control/stop worrying 0  Worry too much - different things 0  Trouble relaxing 0  Restless 0  Easily annoyed or irritable 0  Afraid - awful might happen 0  Total GAD 7 Score 0  Anxiety Difficulty Not difficult at all    Pertinent labs & imaging results that were available during my care of the patient were reviewed by me  and considered  in my medical decision making.  Assessment & Plan:  Rodney Guerrero was seen today for addiction problem.  Diagnoses and all orders for this visit:  Substance abuse [facility]) Coordinated with counselor, Delrae Field. Called [facility] in [place] and spoke with Gayl Katos who stated they could take patient today without a formal referral. Provided patient with pamphlet and contact information for admission. In addition, provided him with [facility] information and information for [facility]. Denies SI. Safety contract established. Discussed with patient that I do not complete withdrawal care and that he would likely need follow up with psychiatry after.     Continue all other maintenance medications.  Follow up plan: Return if symptoms worsen or fail to improve.   Written and verbal instructions provided   The above assessment and management plan was discussed with the patient. The patient verbalized understanding of and has agreed to the management plan. Patient is aware to call the clinic if they develop any new symptoms or if symptoms persist or worsen. Patient is aware when to return to the clinic for a follow-up visit. Patient educated on when it is appropriate to go to the emergency department.   Jacqualyn Mates, DNP-FNP [facility] [address] [place], [place] [address] [phone]

## 2024-02-13 NOTE — Patient Instructions (Signed)
 [facility]  Phone:  [phone]  Address:  [address]  [place], [place] [address]  Hours:  Open 24/7, No appointment required.    The [facility] ([facility] is centrally located in the [place] at [address], [place]

## 2024-05-19 ENCOUNTER — Ambulatory Visit (INDEPENDENT_AMBULATORY_CARE_PROVIDER_SITE_OTHER): Admitting: *Deleted

## 2024-05-19 VITALS — BP 109/70 | HR 52

## 2024-05-19 DIAGNOSIS — Z0130 Encounter for examination of blood pressure without abnormal findings: Secondary | ICD-10-CM | POA: Diagnosis not present

## 2024-05-19 NOTE — Addendum Note (Signed)
 Addended by: JODENE CORNERS B on: 05/19/2024 10:08 AM   Modules accepted: Level of Service

## 2024-05-19 NOTE — Progress Notes (Signed)
 Patient walked into office today concerned about a few high blood pressure readings he has received at home and the Suboxone clinic recently. Denies any symptoms. Vitals checked and in normal range. Patient advised to schedule establish care visit with a new provider in the near future as his provider is no longer at our office.
